# Patient Record
Sex: Male | Born: 1995 | Race: Black or African American | Hispanic: No | Marital: Single | Smoking: Never smoker
Health system: Southern US, Community
[De-identification: ages and names within clinical notes are randomized; demographics above are authoritative.]

## PROBLEM LIST (undated history)

## (undated) ENCOUNTER — Ambulatory Visit: Source: Home / Self Care

## (undated) DIAGNOSIS — IMO0001 Reserved for inherently not codable concepts without codable children: Secondary | ICD-10-CM

## (undated) HISTORY — PX: TONSILLECTOMY: SUR1361

## (undated) HISTORY — DX: Reserved for inherently not codable concepts without codable children: IMO0001

---
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
## 2017-09-21 ENCOUNTER — Emergency Department (HOSPITAL_COMMUNITY)
Admission: EM | Admit: 2017-09-21 | Discharge: 2017-09-22 | Disposition: A | Discharge status: Home / Self Care | Payer: BLUE CROSS/BLUE SHIELD | Attending: Emergency Medicine | Admitting: Emergency Medicine

## 2017-09-21 ENCOUNTER — Encounter (HOSPITAL_COMMUNITY): Payer: Self-pay | Admitting: Emergency Medicine

## 2017-09-21 ENCOUNTER — Other Ambulatory Visit: Payer: Self-pay

## 2017-09-21 DIAGNOSIS — Z5321 Procedure and treatment not carried out due to patient leaving prior to being seen by health care provider: Secondary | ICD-10-CM | POA: Insufficient documentation

## 2017-09-21 DIAGNOSIS — H5712 Ocular pain, left eye: Secondary | ICD-10-CM | POA: Insufficient documentation

## 2017-09-21 MED ORDER — FLUORESCEIN SODIUM 1 MG OP STRP: 1.0000 | ORAL_STRIP | Freq: Once | OPHTHALMIC | Status: DC

## 2017-09-21 MED ORDER — TETRACAINE HCL 0.5 % OP SOLN: 2.0000 [drp] | Freq: Once | OPHTHALMIC | Status: DC

## 2017-09-21 NOTE — ED Triage Notes (Signed)
Pt presents with L eye pain with tearing; pt states he felt like there was something in the eye

## 2017-09-22 NOTE — ED Notes (Signed)
Pt's name called to recheck vitals no answer x3

## 2017-10-03 ENCOUNTER — Emergency Department (HOSPITAL_COMMUNITY)
Admission: EM | Admit: 2017-10-03 | Discharge: 2017-10-03 | Disposition: A | Discharge status: Home / Self Care | Payer: Self-pay | Attending: Emergency Medicine | Admitting: Emergency Medicine

## 2017-10-03 ENCOUNTER — Other Ambulatory Visit: Payer: Self-pay

## 2017-10-03 ENCOUNTER — Encounter (HOSPITAL_COMMUNITY): Payer: Self-pay

## 2017-10-03 DIAGNOSIS — M62830 Muscle spasm of back: Secondary | ICD-10-CM | POA: Insufficient documentation

## 2017-10-03 MED ORDER — NAPROXEN 500 MG PO TABS: 500.0000 mg | ORAL_TABLET | Freq: Two times a day (BID) | ORAL | 0 refills | Status: DC

## 2017-10-03 MED ORDER — KETOROLAC TROMETHAMINE 15 MG/ML IJ SOLN
30.0000 mg | Freq: Once | INTRAMUSCULAR | Status: AC
  Administered 2017-10-03: 30 mg via INTRAMUSCULAR
  Filled 2017-10-03: qty 2

## 2017-10-03 MED ORDER — METHOCARBAMOL 500 MG PO TABS: 500.0000 mg | ORAL_TABLET | Freq: Two times a day (BID) | ORAL | 0 refills | Status: DC

## 2017-10-03 NOTE — ED Provider Notes (Signed)
[facility]-EMERGENCY DEPT Provider Note   CSN: [id number] Arrival date & time: 10/03/17  1823     History   Chief Complaint Chief Complaint  Patient presents with  . Back Pain    HPI Daniel Bryan is a 22 y.o. male who presents to ED for evaluation of 1/2-day history history of upper back pain.  States that he woke up yesterday with the pain.  He is unsure if he "strained a muscle or something."  Applied muscle rub in the area with only mild improvement in symptoms.  Reports history of similar symptoms in his lower back.  Denies any prior back or neck surgeries, fever, injuries or falls, numbness in arms or legs, chest pain or shortness of breath, history of asthma.  HPI  Past Medical History:  Diagnosis Date  . Healthy adult     There are no active problems to display for this patient.   Past Surgical History:  Procedure Laterality Date  . TONSILLECTOMY          Home Medications    Prior to Admission medications   Medication Sig Start Date End Date Taking? Authorizing Provider  methocarbamol (ROBAXIN) 500 MG tablet Take 1 tablet (500 mg total) by mouth 2 (two) times daily. 10/03/17   Ngoc Daughtridge, PA-C  naproxen (NAPROSYN) 500 MG tablet Take 1 tablet (500 mg total) by mouth 2 (two) times daily. 10/03/17   Dietrich PatesKhatri, [place], PA-C    Family History Family History  Problem Relation Age of Onset  . Hypertension Father   . Healthy Sister   . Healthy Sister     Social History Social History   Tobacco Use  . Smoking status: Never Smoker  . Smokeless tobacco: Never Used  Substance Use Topics  . Alcohol use: No    Alcohol/week: 0.0 standard drinks  . Drug use: No     Allergies   Adderall [amphetamine-dextroamphetamine] and Versed [midazolam]   Review of Systems Review of Systems  Constitutional: Negative for chills and fever.  Respiratory: Negative for shortness of breath.   Cardiovascular: Negative for chest pain.  Musculoskeletal:  Positive for back pain and myalgias.  Skin: Negative for wound.  Neurological: Negative for weakness and numbness.     Physical Exam Updated Vital Signs BP 134/69 (BP Location: Left Arm)   Pulse 60   Temp 98.6 F (37 C) (Oral)   Resp 16   Ht 6\' 2"  (1.88 m)   Wt 96.6 kg   SpO2 100%   BMI 27.35 kg/m   Physical Exam  Constitutional: He appears well-developed and well-nourished. No distress.  HENT:  Head: Normocephalic and atraumatic.  Eyes: Conjunctivae and EOM are normal. No scleral icterus.  Neck: Normal range of motion.  Cardiovascular: Normal rate, regular rhythm and normal heart sounds.  Pulmonary/Chest: Effort normal and breath sounds normal. No respiratory distress.  Musculoskeletal:       Back:  No midline spinal tenderness present in lumbar, thoracic or cervical spine. No step-off palpated. No visible bruising, edema or temperature change noted. No objective signs of numbness present. No saddle anesthesia. 2+ DP pulses bilaterally. Sensation intact to light touch. Strength 5/5 in bilateral lower extremities.  Neurological: He is alert.  Skin: No rash noted. He is not diaphoretic.  Psychiatric: He has a normal mood and affect.  Nursing note and vitals reviewed.    ED Treatments / Results  Labs (all labs ordered are listed, but only abnormal results are displayed) Labs Reviewed - No  data to display  EKG None  Radiology No results found.  Procedures Procedures (including critical care time)  Medications Ordered in ED Medications  ketorolac (TORADOL) 15 MG/ML injection 30 mg (has no administration in time range)     Initial Impression / Assessment and Plan / ED Course  I have reviewed the triage vital signs and the nursing notes.  Pertinent labs & imaging results that were available during my care of the patient were reviewed by me and considered in my medical decision making (see chart for details).     Patient denies any concerning symptoms  suggestive of cauda equina requiring urgent imaging at this time such as loss of sensation in the lower extremities, lower extremity weakness, loss of bowel or bladder control, saddle anesthesia, urinary retention, fever/chills, IVDU. Exam demonstrated no  weakness on exam today. No preceding injury or trauma to suggest acute fracture. Doubt AAA as cause of patient's back pain as patient lacks major risk factors and has symmetric and intact distal pulses. Patient given strict return precautions for any symptoms indicating worsening neurologic function in the lower extremities.  Portions of this note were generated with Scientist, clinical (histocompatibility and immunogenetics). Dictation errors may occur despite best attempts at proofreading.  Final Clinical Impressions(s) / ED Diagnoses   Final diagnoses:  Muscle spasm of back    ED Discharge Orders         Ordered    methocarbamol (ROBAXIN) 500 MG tablet  2 times daily     10/03/17 1926    naproxen (NAPROSYN) 500 MG tablet  2 times daily     10/03/17 1926           Dietrich Pates, PA-C 10/03/17 1927    Terrilee Files, MD 10/04/17 [phone]

## 2017-10-03 NOTE — Discharge Instructions (Signed)
Return to ED for worsening symptoms, injuries or falls, numbness in arms or legs, chest pain or shortness of breath.

## 2017-10-03 NOTE — ED Triage Notes (Signed)
Pt states that he has had pain between his shoulder blades since Tuesday. Pt states no injury/trauma.

## 2017-10-11 ENCOUNTER — Emergency Department (HOSPITAL_COMMUNITY)
Admission: EM | Admit: 2017-10-11 | Discharge: 2017-10-11 | Disposition: A | Discharge status: Home / Self Care | Payer: Self-pay | Attending: Emergency Medicine | Admitting: Emergency Medicine

## 2017-10-11 ENCOUNTER — Encounter (HOSPITAL_COMMUNITY): Payer: Self-pay

## 2017-10-11 ENCOUNTER — Emergency Department (HOSPITAL_COMMUNITY): Payer: Self-pay

## 2017-10-11 ENCOUNTER — Other Ambulatory Visit: Payer: Self-pay

## 2017-10-11 DIAGNOSIS — M546 Pain in thoracic spine: Secondary | ICD-10-CM | POA: Insufficient documentation

## 2017-10-11 MED ORDER — NAPROXEN 500 MG PO TABS
500.0000 mg | ORAL_TABLET | Freq: Once | ORAL | Status: AC
  Administered 2017-10-11: 500 mg via ORAL
  Filled 2017-10-11: qty 1

## 2017-10-11 MED ORDER — METHOCARBAMOL 500 MG PO TABS
500.0000 mg | ORAL_TABLET | Freq: Once | ORAL | Status: AC
  Administered 2017-10-11: 500 mg via ORAL
  Filled 2017-10-11: qty 1

## 2017-10-11 MED ORDER — LIDOCAINE 5 % EX PTCH
1.0000 | MEDICATED_PATCH | CUTANEOUS | Status: DC
  Administered 2017-10-11: 1 via TRANSDERMAL
  Filled 2017-10-11: qty 1

## 2017-10-11 MED ORDER — NAPROXEN 500 MG PO TABS: 500.0000 mg | ORAL_TABLET | Freq: Two times a day (BID) | ORAL | 0 refills | Status: AC

## 2017-10-11 MED ORDER — METHOCARBAMOL 500 MG PO TABS: 500.0000 mg | ORAL_TABLET | Freq: Two times a day (BID) | ORAL | 0 refills | Status: AC

## 2017-10-11 NOTE — ED Provider Notes (Signed)
[facility]-EMERGENCY DEPT Provider Note   CSN: [id number] Arrival date & time: 10/11/17  1214     History   Chief Complaint Chief Complaint  Patient presents with  . Back Pain    HPI Daniel Bryan is a 22 y.o. male.  Daniel Bryan is a 22 y.o. Male who is otherwise healthy, presents to the emergency department for evaluation of pain in between his shoulder blades.  He reports his pain first started about 8 days ago and he was seen here in the emergency department, thought to be muscle strain, he was prescribed NSAIDs and a muscle relaxer but he reports he never filled these prescriptions had some muscle relaxers at home that he was taking occasionally but has not been doing anything consistently for this pain.  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  He reports the pain feels like a tight knot in between his shoulder blades.  He reports the pain seems to wax and wane but has not completely resolved.  His boss recommended he return to get the back pain checked out again.  He denies any chest pain or shortness of breath, pain is not worse with deep breathing, pain is only worse with movement.  He denies any numbness tingling or weakness in his extremities.  No loss of bowel or bladder control, no saddle anesthesia.  No fevers or abdominal pain.  No history of cancer or IV drug use.     Past Medical History:  Diagnosis Date  . Healthy adult     There are no active problems to display for this patient.   Past Surgical History:  Procedure Laterality Date  . TONSILLECTOMY          Home Medications    Prior to Admission medications   Medication Sig Start Date End Date Taking? Authorizing Provider  methocarbamol (ROBAXIN) 500 MG tablet Take 1 tablet (500 mg total) by mouth 2 (two) times daily. 10/03/17   Khatri, Hina, PA-C  naproxen (NAPROSYN) 500 MG tablet Take 1  tablet (500 mg total) by mouth 2 (two) times daily. 10/03/17   Dietrich Pates, PA-C    Family History Family History  Problem Relation Age of Onset  . Hypertension Father   . Healthy Sister   . Healthy Sister     Social History Social History   Tobacco Use  . Smoking status: Never Smoker  . Smokeless tobacco: Never Used  Substance Use Topics  . Alcohol use: No    Alcohol/week: 0.0 standard drinks  . Drug use: No     Allergies   Adderall [amphetamine-dextroamphetamine] and Versed [midazolam]   Review of Systems Review of Systems  Constitutional: Negative for chills and fever.  HENT: Negative.   Eyes: Negative.   Respiratory: Negative for shortness of breath.   Cardiovascular: Negative for chest pain.  Gastrointestinal: Negative for abdominal pain, nausea and vomiting.  Genitourinary: Negative.   Musculoskeletal: Positive for back pain. Negative for arthralgias, myalgias and neck pain.  Skin: Negative for color change and rash.  Neurological: Negative for weakness and numbness.     Physical Exam Updated Vital Signs BP (!) 142/75   Pulse (!) 53   Temp 98.4 F (36.9 C) (Oral)   Resp 16   Ht 6\' 2"  (1.88 m)   Wt 106.1 kg   SpO2 99%   BMI 30.04 kg/m   Physical Exam  Constitutional:  He is oriented to person, place, and time. He appears well-developed and well-nourished. No distress.  HENT:  Head: Atraumatic.  Eyes: Right eye exhibits no discharge. Left eye exhibits no discharge.  Neck: Neck supple.  C-spine NTTP  Cardiovascular: Normal rate, regular rhythm, normal heart sounds and intact distal pulses.  Pulses:      Radial pulses are 2+ on the right side, and 2+ on the left side.       Dorsalis pedis pulses are 2+ on the right side, and 2+ on the left side.       Posterior tibial pulses are 2+ on the right side, and 2+ on the left side.  Pulmonary/Chest: Effort normal and breath sounds normal. No respiratory distress.  Respirations equal and unlabored,  patient able to speak in full sentences, lungs clear to auscultation bilaterally  Abdominal: Soft. Bowel sounds are normal. He exhibits no distension and no mass. There is no tenderness. There is no guarding.  Abdomen soft, nondistended, nontender to palpation in all quadrants without guarding or peritoneal signs, no CVA tenderness bilaterally  Musculoskeletal:       Back:  Tenderness to palpation over mid thoracic back between shoulder blades. No palpable deformity. Normal ROM of shoulders.   Neurological: He is alert and oriented to person, place, and time.  Alert, clear speech, following commands. Moving all extremities without difficulty. Bilateral upper and lower extremities with 5/5 strength in proximal and distal muscle groups and with dorsi and plantar flexion. Sensation intact in bilateral upper and lower extremities. Ambulatory with steady gait  Skin: Skin is warm and dry. Capillary refill takes less than 2 seconds. He is not diaphoretic.  Psychiatric: He has a normal mood and affect. His behavior is normal.  Nursing note and vitals reviewed.    ED Treatments / Results  Labs (all labs ordered are listed, but only abnormal results are displayed) Labs Reviewed - No data to display  EKG None  Radiology Dg Thoracic Spine 2 View  Result Date: 10/11/2017 CLINICAL DATA:  Upper back pain today and 8 days ago. EXAM: THORACIC SPINE 2 VIEWS COMPARISON:  None. FINDINGS: Mild anterior spur formation at the C7-T1 level. Otherwise, normal appearing bones and soft tissues. No fractures or subluxations. IMPRESSION: Mild degenerative changes at the C7-T1 level. Otherwise, normal examination. Electronically Signed   By: Beckie Salts M.D.   On: 10/11/2017 13:29    Procedures Procedures (including critical care time)  Medications Ordered in ED Medications  lidocaine (LIDODERM) 5 % 1 patch (1 patch Transdermal Patch Applied 10/11/17 1252)  naproxen (NAPROSYN) tablet 500 mg (500 mg Oral  Given 10/11/17 1252)  methocarbamol (ROBAXIN) tablet 500 mg (500 mg Oral Given 10/11/17 1252)     Initial Impression / Assessment and Plan / ED Course  I have reviewed the triage vital signs and the nursing notes.  Pertinent labs & imaging results that were available during my care of the patient were reviewed by me and considered in my medical decision making (see chart for details).  Patient presents for evaluation of thoracic back pain.  Pain in between his shoulder blades describes as a knot-like sensation.  No associated chest pain or shortness of breath, pain is not pleuritic in nature.  He has no risk factors for PE, and is PERC negative.  Tenderness to palpation in the middle of the low back, no numbness tingling or weakness in his extremities and normal neurologic exam here in the ED.  X-ray shows some C7-T1 degenerative  changes but no other bony abnormalities noted.  Will treat with muscle relaxers, NSAIDs and lidocaine patches.  Advised patient of no heavy lifting.  Return precautions discussed.  Patient expresses understanding and is in agreement with plan.  Stable for discharge at this time.  Final Clinical Impressions(s) / ED Diagnoses   Final diagnoses:  Acute midline thoracic back pain    ED Discharge Orders         Ordered    naproxen (NAPROSYN) 500 MG tablet  2 times daily     10/11/17 1347    methocarbamol (ROBAXIN) 500 MG tablet  2 times daily     10/11/17 1347           Dartha Lodge, [place] 10/11/17 1348    Gwyneth Sprout, MD 10/11/17 2136

## 2017-10-11 NOTE — ED Triage Notes (Signed)
Patient c/o upper back pain 8 days ago. Patient states he woke with the upper back pain. Patient states his work is making him come and get his back pain checked out.

## 2017-10-11 NOTE — Discharge Instructions (Signed)
X-rays look good today.  This is likely due to muscle spasm.  Please take Naprosyn twice daily and Robaxin at night to help with spasm, this can cause drowsiness do not take before driving.  You can also use over-the-counter salon pas lidocaine patches.  No heavy lifting.  Follow-up with your primary care doctor if symptoms are not improving.  Return for significantly worsened back pain, chest pain or shortness of breath, numbness weakness or tingling in your arms or legs or any other new or concerning symptoms.

## 2019-06-26 IMAGING — CR DG THORACIC SPINE 2V
3 series · 3 of 3 positions shown · non-contrast
Comparison: None.

CLINICAL DATA: Upper back pain today and 8 days ago.

EXAM:
THORACIC SPINE 2 VIEWS

[t thoracic spine ap]
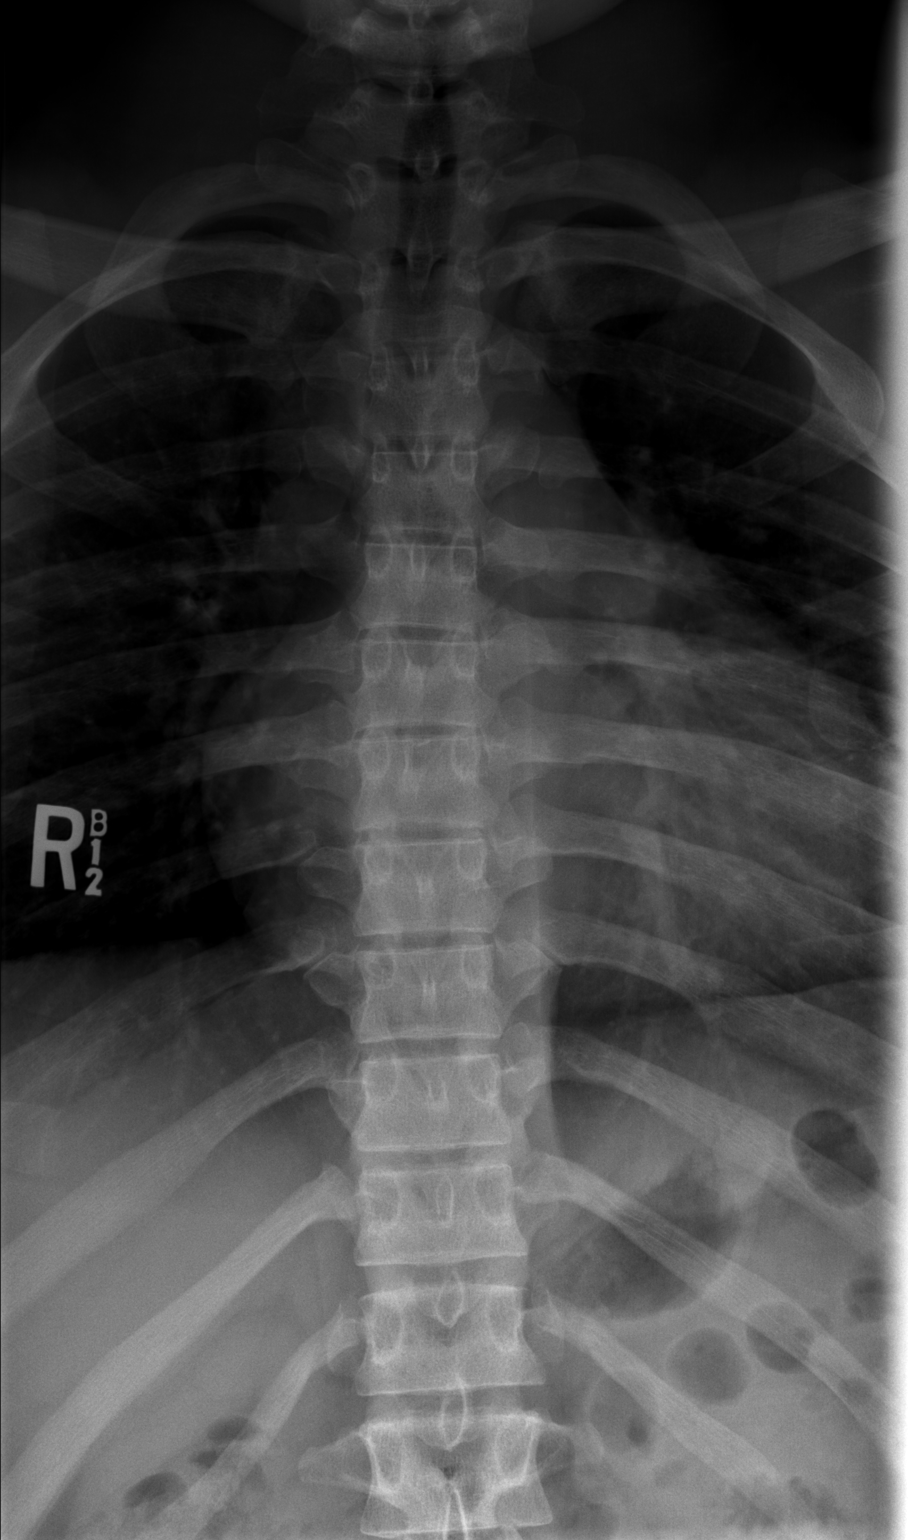

[t thoracic spine lat]
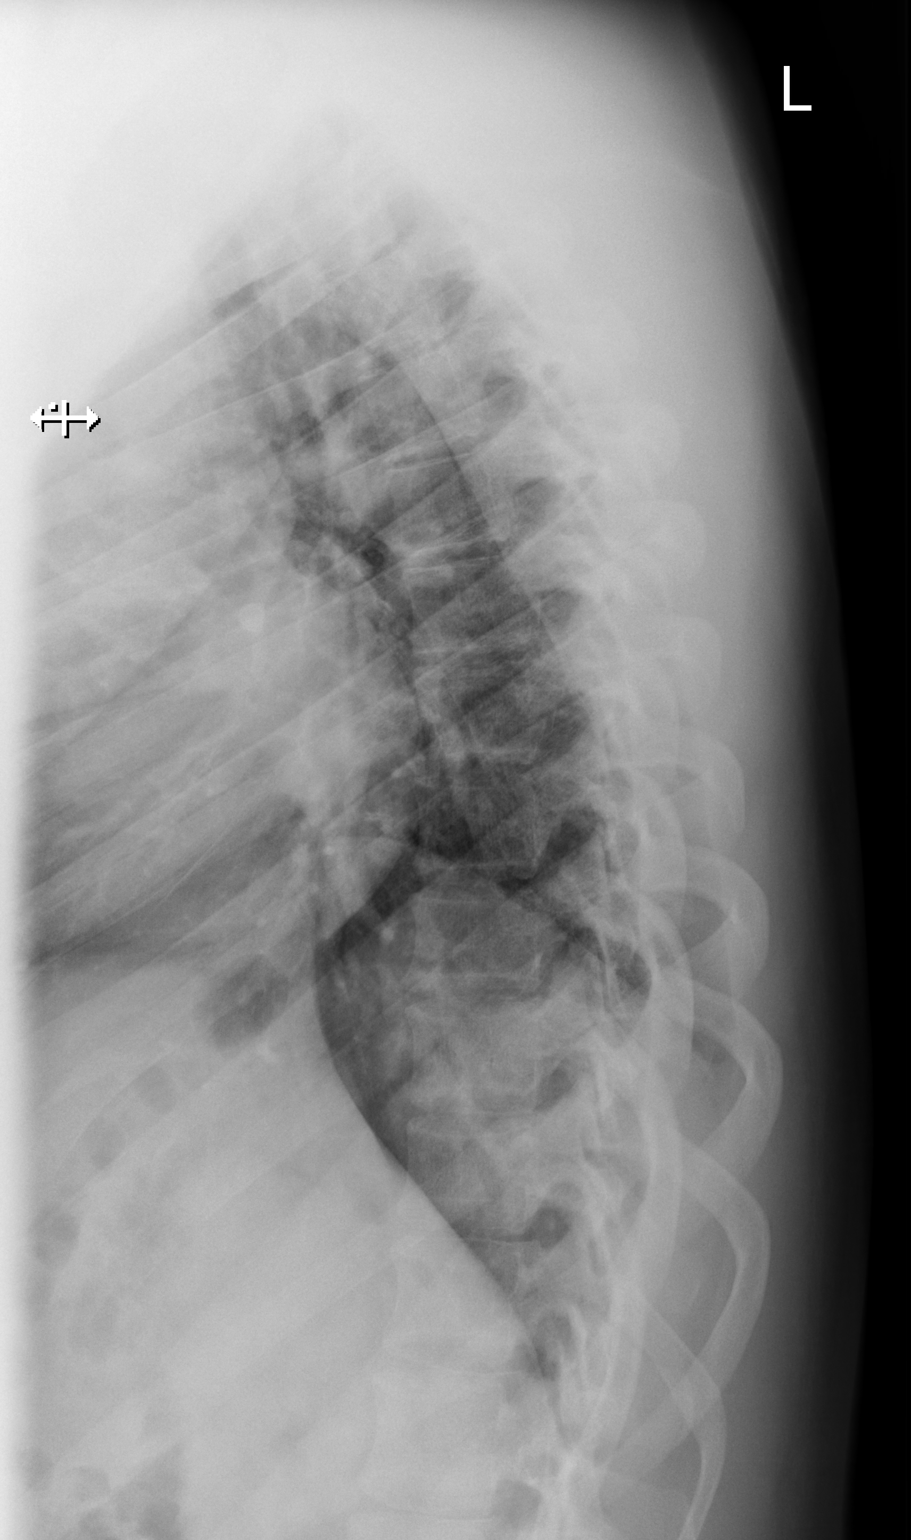

[t thoracic swimmers]
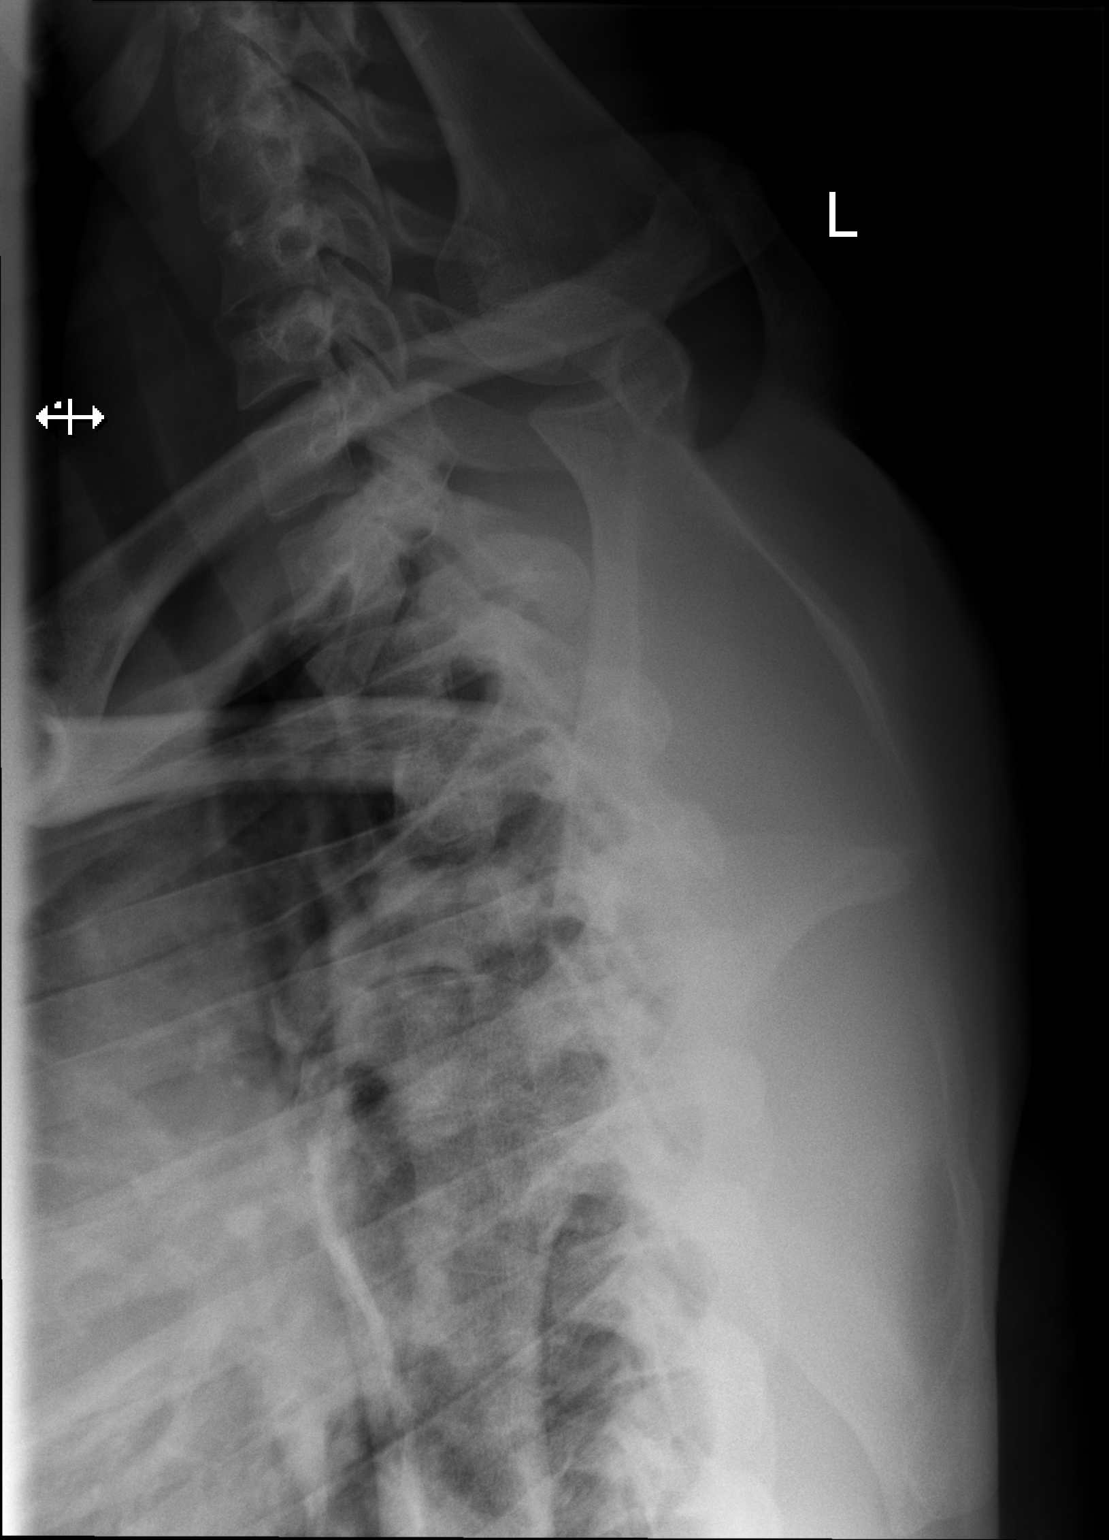

[3 of 3 positions shown; findings below may reference images not displayed]

FINDINGS: Mild anterior spur formation at the C7-T1 level. Otherwise, normal
appearing bones and soft tissues. No fractures or subluxations.
IMPRESSION: Mild degenerative changes at the C7-T1 level. Otherwise, normal
examination.

## 2021-05-12 ENCOUNTER — Other Ambulatory Visit: Payer: Self-pay

## 2021-05-12 ENCOUNTER — Encounter (HOSPITAL_COMMUNITY): Payer: Self-pay

## 2021-05-12 ENCOUNTER — Emergency Department (HOSPITAL_COMMUNITY)
Admission: EM | Admit: 2021-05-12 | Discharge: 2021-05-12 | Disposition: A | Discharge status: Home / Self Care | Payer: Self-pay | Attending: Emergency Medicine | Admitting: Emergency Medicine

## 2021-05-12 ENCOUNTER — Emergency Department (HOSPITAL_COMMUNITY): Payer: Self-pay

## 2021-05-12 DIAGNOSIS — S40012A Contusion of left shoulder, initial encounter: Secondary | ICD-10-CM | POA: Insufficient documentation

## 2021-05-12 DIAGNOSIS — W228XXA Striking against or struck by other objects, initial encounter: Secondary | ICD-10-CM | POA: Insufficient documentation

## 2021-05-12 DIAGNOSIS — M25512 Pain in left shoulder: Secondary | ICD-10-CM

## 2021-05-12 NOTE — Discharge Instructions (Addendum)
It was a pleasure taking care of you!  ? ?Your x-ray was negative for fracture or dislocation.  You may take over the counter 600 mg Ibuprofen every 6 hours or 1,000 mg Tylenol every 6 hours as needed for pain for no more than 7 days. You may apply ice to affected area for up to 15 minutes at a time. Ensure to place a barrier between your skin and the ice.  You may follow-up with your primary care provider as needed.  Return to the Emergency Department if you are experiencing increasing/worsening pain, swelling, color change, fever, or worsening symptoms. ?

## 2021-05-12 NOTE — ED Triage Notes (Signed)
Pt states that he was carrying a broken washer down some stairs and injured his left shoulder/chest on Monday.  ?

## 2021-05-12 NOTE — ED Provider Notes (Signed)
?[facility]-EMERGENCY DEPT ?Provider Note ? ? ?CSN: [id number] ?Arrival date & time: 05/12/21  0356 ? ?  ? ?History ? ?Chief Complaint  ?Patient presents with  ? Shoulder Pain  ? ? ?Daniel Bryan is a 26 y.o. male who presents to the ED complaining of left shoulder pain onset 4 days ago. He notes that he was carrying a washing machine down a flight of stairs with a dolly when the washer machine corner hit him in his left shoulder.  Patient has bruising to the area.  Denies fall. Has tried ice with some relief in his symptoms.  Patient is right-hand dominant.  Denies fever, chills, wound, numbness, tingling, weakness.  ? ?The history is provided by the patient. No language interpreter was used.  ? ?  ? ?Home Medications ?Prior to Admission medications   ?Medication Sig Start Date End Date Taking? Authorizing Provider  ?methocarbamol (ROBAXIN) 500 MG tablet Take 1 tablet (500 mg total) by mouth 2 (two) times daily. 10/11/17   Dartha Lodge, PA-C  ?naproxen (NAPROSYN) 500 MG tablet Take 1 tablet (500 mg total) by mouth 2 (two) times daily. 10/11/17   Dartha Lodge, PA-C  ?   ? ?Allergies    ?Adderall [amphetamine-dextroamphetamine] and Versed [midazolam]   ? ?Review of Systems   ?Review of Systems  ?Constitutional:  Negative for chills and fever.  ?Musculoskeletal:  Positive for arthralgias. Negative for joint swelling.  ?Skin:  Positive for color change. Negative for rash and wound.  ?All other systems reviewed and are negative. ? ?Physical Exam ?Updated Vital Signs ?BP (!) 143/97 (BP Location: Left Arm)   Pulse 60   Temp 97.6 ?F (36.4 ?C) (Oral)   Resp 16   Ht 6\' 2"  (1.88 m)   Wt 124.7 kg   SpO2 98%   BMI 35.31 kg/m?  ?Physical Exam ?Vitals and nursing note reviewed.  ?Constitutional:   ?   General: He is not in acute distress. ?   Appearance: Normal appearance.  ?Eyes:  ?   General: No scleral icterus. ?   Extraocular Movements: Extraocular movements intact.  ?Cardiovascular:  ?   Rate  and Rhythm: Normal rate.  ?Pulmonary:  ?   Effort: Pulmonary effort is normal. No respiratory distress.  ?Musculoskeletal:  ?   Cervical back: Neck supple.  ?   Comments: Mild tenderness to palpation to anterior left shoulder with area of ecchymosis noted.  No obvious deformity or erythema noted.  Full active range of motion of left shoulder.  No tenderness to palpation noted to left trapezius, left bicep tendon, left clavicle, left upper extremity.  Grip strength 5/5 bilaterally.  Strength and sensation intact to the bilateral upper extremities.  Radial pulse intact bilaterally.   ?Skin: ?   General: Skin is warm and dry.  ?   Findings: No bruising, erythema or rash.  ?Neurological:  ?   Mental Status: He is alert.  ?Psychiatric:     ?   Behavior: Behavior normal.  ? ? ?ED Results / Procedures / Treatments   ?Labs ?(all labs ordered are listed, but only abnormal results are displayed) ?Labs Reviewed - No data to display ? ?EKG ?None ? ?Radiology ?DG Shoulder Left ? ?Result Date: 05/12/2021 ?CLINICAL DATA:  26 year old male with history of left-sided shoulder pain. EXAM: LEFT SHOULDER - 2+ VIEW COMPARISON:  No priors. FINDINGS: There is no evidence of fracture or dislocation. There is no evidence of arthropathy or other focal bone abnormality. Soft tissues  are unremarkable. IMPRESSION: Negative. Electronically Signed   By: Trudie Reed M.D.   On: 05/12/2021 07:13   ? ?Procedures ?Procedures  ? ? ?Medications Ordered in ED ?Medications - No data to display ? ?ED Course/ Medical Decision Making/ A&P ?Clinical Course as of 05/12/21 0721  ?Thu May 12, 2021  ?0719 Discussed discharge treatment plan with patient at bedside. Answered all available questions. Pt appears safe for discharge. [SB]  ?  ?Clinical Course User Index ?[SB] Teagyn Fishel A, PA-C  ? ?                        ?Medical Decision Making ?Amount and/or Complexity of Data Reviewed ?Radiology: ordered. ? ? ?Patient with left shoulder pain onset 4 days  status post carrying a washing machine on a dolly down a flight of stairs and the washing machine hitting his shoulder.  Vital signs stable, patient afebrile.  On exam patient with mild tenderness to palpation to anterior left shoulder with area of ecchymosis noted.  No obvious deformity or erythema noted.  Full active range of motion of left shoulder.  No tenderness to palpation noted to left trapezius, left bicep tendon, left clavicle, left upper extremity.  Grip strength 5/5 bilaterally.  Strength and sensation intact to the bilateral upper extremities.  Radial pulse intact bilaterally.  Differential diagnosis includes contusion, fracture, dislocation.  ? ?Imaging: ?I ordered imaging studies including left shoulder x-ray ?I independently visualized and interpreted imaging which showed: negative for acute fracture or dislocation ?I agree with the radiologist interpretation ? ?Medications:  ?I ordered medication including ice for pain management  ?I have reviewed the patients home medicines and have made adjustments as needed ? ? ?Disposition: ?Presentation suspicious for contusion of left shoulder. Doubt fracture or dislocation at this time. After consideration of the diagnostic results and the patients response to treatment, I feel that the patient would benefit from Discharge home.  Work note provided. Supportive care measures and strict return precautions discussed with patient at bedside. Pt acknowledges and verbalizes understanding. Pt appears safe for discharge. Follow up as indicated in discharge paperwork.  ? ? ?This chart was dictated using voice recognition software, Dragon. Despite the best efforts of this provider to proofread and correct errors, errors may still occur which can change documentation meaning. ? ? ?Final Clinical Impression(s) / ED Diagnoses ?Final diagnoses:  ?Acute pain of left shoulder  ?Contusion of left shoulder, initial encounter  ? ? ?Rx / DC Orders ?ED Discharge Orders   ? ? None   ? ?  ? ? ?  ?Ellina Sivertsen A, PA-C ?05/12/21 8921 ? ?  ?Virgina Norfolk, DO ?05/12/21 1941 ? ?

## 2023-01-25 IMAGING — CR DG SHOULDER 2+V*L*
3 series · 3 of 3 positions shown · non-contrast
Comparison: No priors.

CLINICAL DATA: 26-year-old male with history of left-sided shoulder
pain.

EXAM:
LEFT SHOULDER - 2+ VIEW

[w shoulder external left]
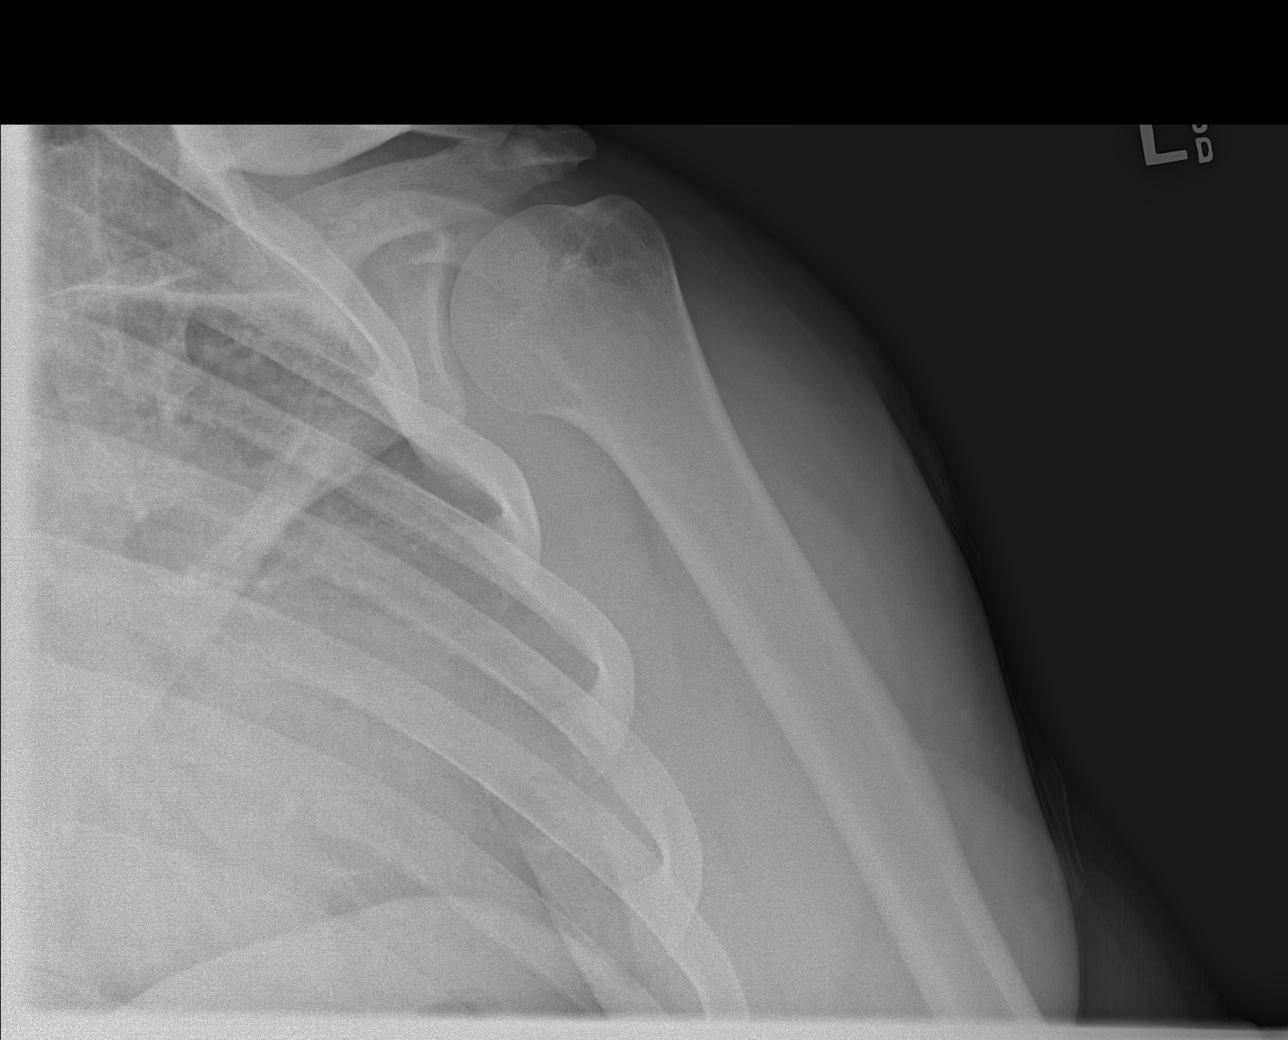

[w shoulder y-view left]
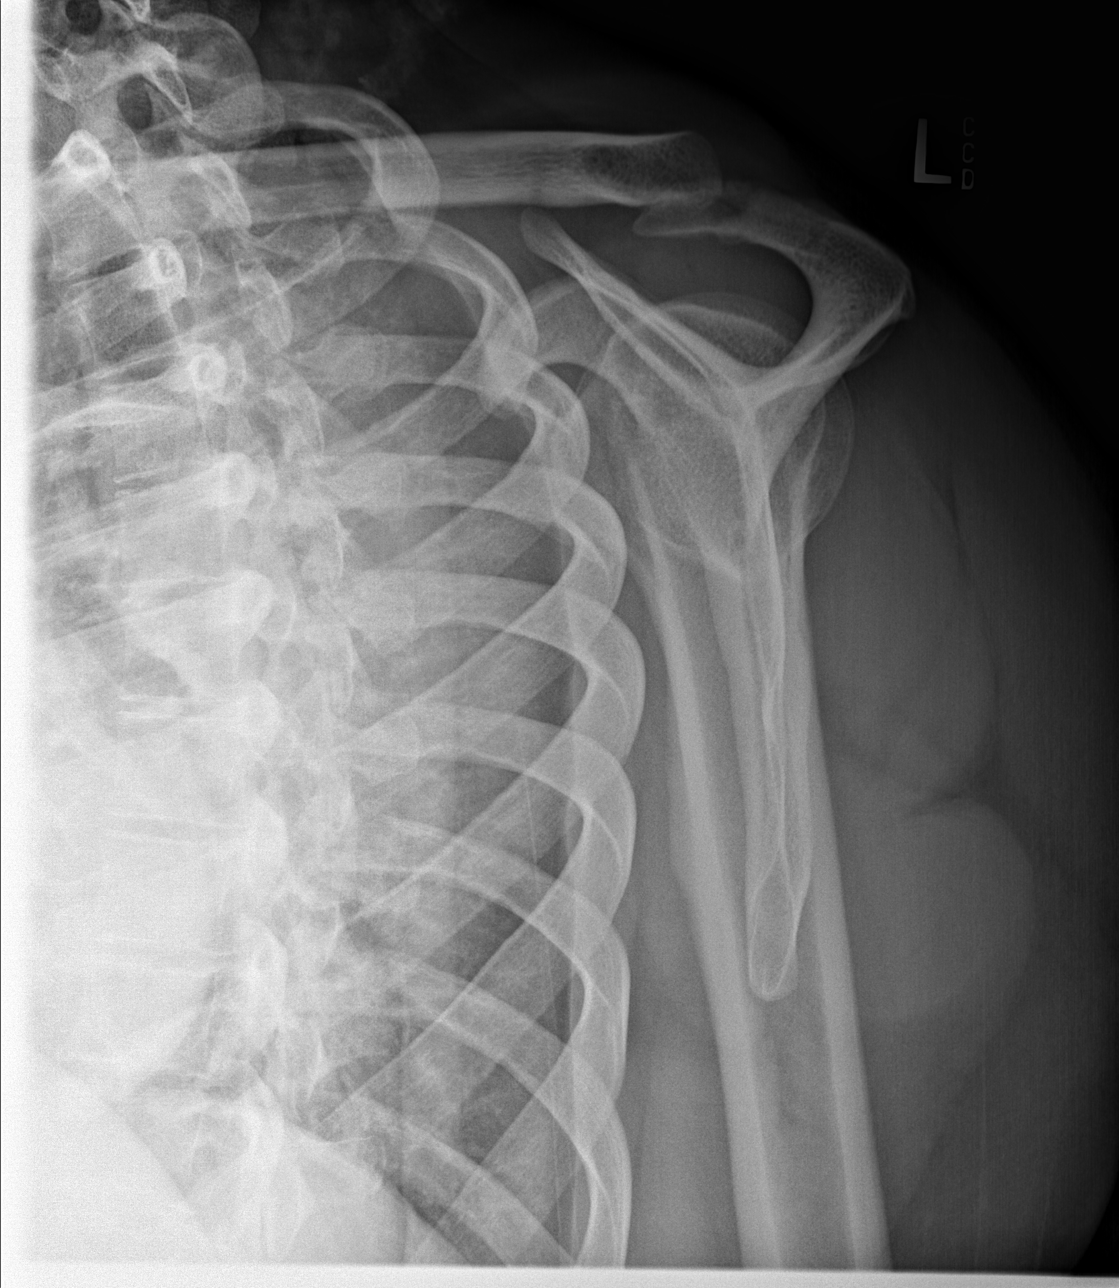

[x shoulder axillary left]
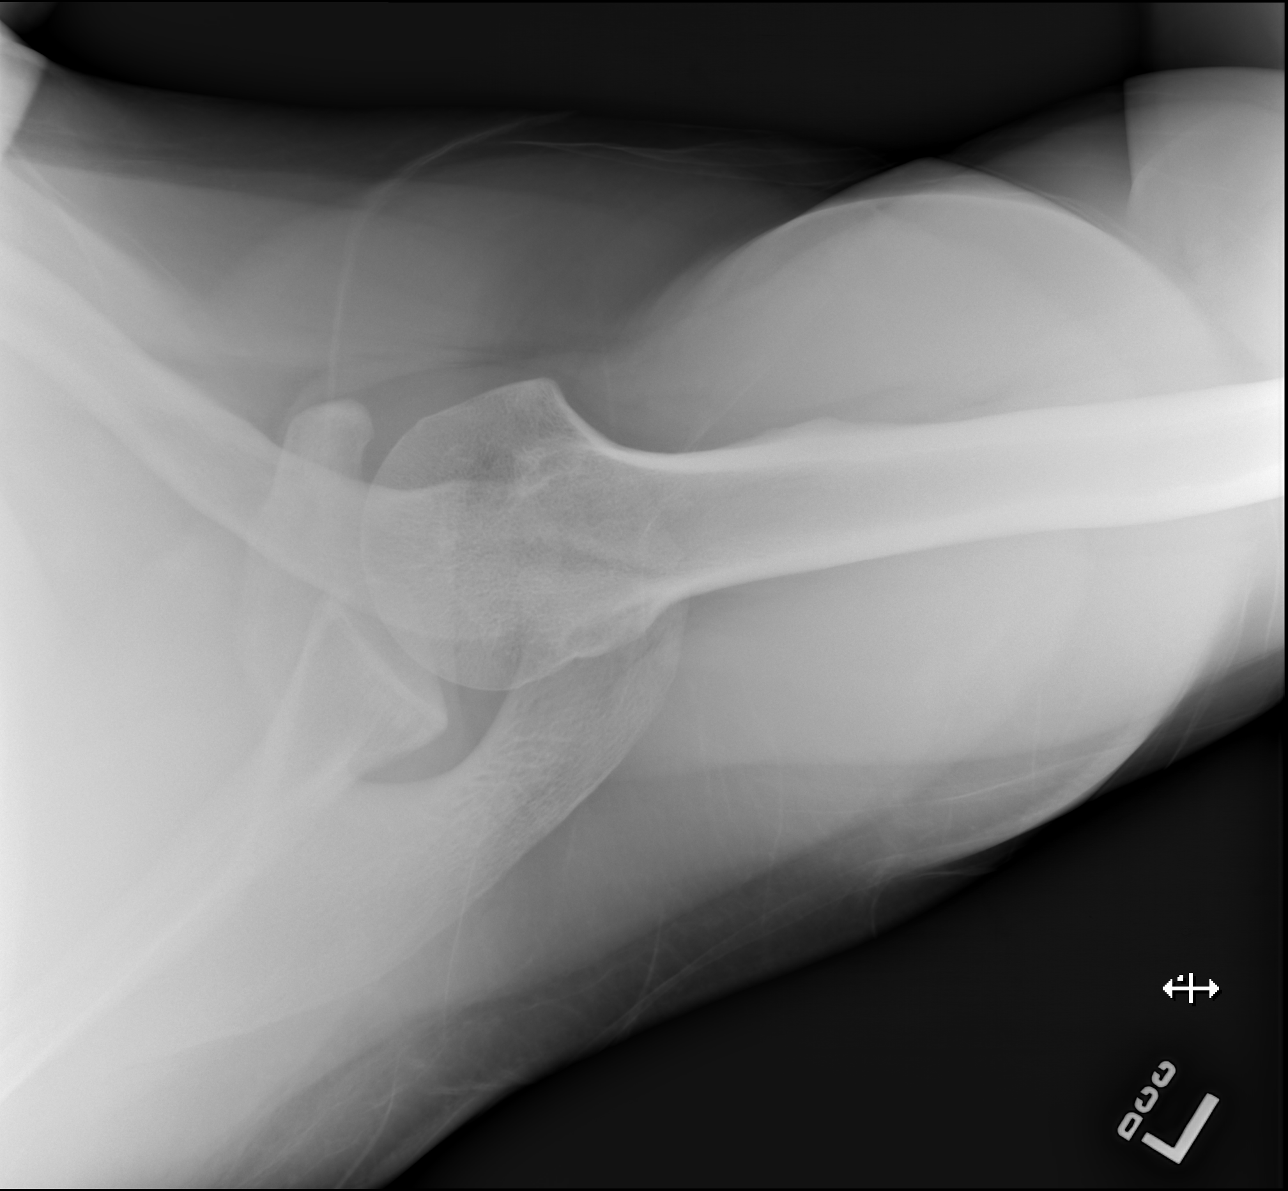

[3 of 3 positions shown; findings below may reference images not displayed]

FINDINGS: There is no evidence of fracture or dislocation. There is no
evidence of arthropathy or other focal bone abnormality. Soft
tissues are unremarkable.
IMPRESSION: Negative.
